# Patient Record
Sex: Male | Born: 1975 | Race: White | Hispanic: No | Smoking: Former smoker
Health system: Southern US, Community
[De-identification: ages and names within clinical notes are randomized; demographics above are authoritative.]

---

## 2006-05-05 ENCOUNTER — Ambulatory Visit (HOSPITAL_BASED_OUTPATIENT_CLINIC_OR_DEPARTMENT_OTHER): Admission: RE | Admit: 2006-05-05 | Discharge: 2006-05-05 | Payer: Self-pay | Admitting: Otolaryngology

## 2017-01-24 DIAGNOSIS — L309 Dermatitis, unspecified: Secondary | ICD-10-CM | POA: Diagnosis not present

## 2017-01-24 DIAGNOSIS — A6000 Herpesviral infection of urogenital system, unspecified: Secondary | ICD-10-CM | POA: Diagnosis not present

## 2017-05-23 DIAGNOSIS — Z0000 Encounter for general adult medical examination without abnormal findings: Secondary | ICD-10-CM | POA: Diagnosis not present

## 2017-05-23 DIAGNOSIS — Z23 Encounter for immunization: Secondary | ICD-10-CM | POA: Diagnosis not present

## 2017-05-23 DIAGNOSIS — E785 Hyperlipidemia, unspecified: Secondary | ICD-10-CM | POA: Diagnosis not present

## 2017-08-30 DIAGNOSIS — Z3009 Encounter for other general counseling and advice on contraception: Secondary | ICD-10-CM | POA: Diagnosis not present

## 2017-10-06 ENCOUNTER — Other Ambulatory Visit: Payer: Self-pay | Admitting: Family Medicine

## 2017-10-06 DIAGNOSIS — Z302 Encounter for sterilization: Secondary | ICD-10-CM | POA: Diagnosis not present

## 2019-05-13 DIAGNOSIS — A6000 Herpesviral infection of urogenital system, unspecified: Secondary | ICD-10-CM | POA: Diagnosis not present

## 2020-02-18 DIAGNOSIS — M67911 Unspecified disorder of synovium and tendon, right shoulder: Secondary | ICD-10-CM | POA: Diagnosis not present

## 2020-06-05 DIAGNOSIS — A6000 Herpesviral infection of urogenital system, unspecified: Secondary | ICD-10-CM | POA: Diagnosis not present

## 2020-10-26 ENCOUNTER — Encounter (HOSPITAL_COMMUNITY): Admission: EM | Disposition: A | Discharge status: Home / Self Care | Payer: Self-pay | Source: Home / Self Care

## 2020-10-26 ENCOUNTER — Inpatient Hospital Stay (HOSPITAL_COMMUNITY): Payer: BC Managed Care – PPO | Admitting: Certified Registered Nurse Anesthetist

## 2020-10-26 ENCOUNTER — Inpatient Hospital Stay (HOSPITAL_COMMUNITY)
Admission: EM | Admit: 2020-10-26 | Discharge: 2020-10-29 | DRG: 330 | Disposition: A | Discharge status: Home / Self Care | Payer: BC Managed Care – PPO | Attending: General Surgery | Admitting: General Surgery

## 2020-10-26 ENCOUNTER — Other Ambulatory Visit: Payer: Self-pay

## 2020-10-26 ENCOUNTER — Encounter (HOSPITAL_COMMUNITY): Payer: Self-pay

## 2020-10-26 ENCOUNTER — Emergency Department (HOSPITAL_COMMUNITY): Payer: BC Managed Care – PPO

## 2020-10-26 DIAGNOSIS — E872 Acidosis: Secondary | ICD-10-CM | POA: Diagnosis not present

## 2020-10-26 DIAGNOSIS — K56609 Unspecified intestinal obstruction, unspecified as to partial versus complete obstruction: Secondary | ICD-10-CM | POA: Diagnosis not present

## 2020-10-26 DIAGNOSIS — R7989 Other specified abnormal findings of blood chemistry: Secondary | ICD-10-CM

## 2020-10-26 DIAGNOSIS — K55029 Acute infarction of small intestine, extent unspecified: Secondary | ICD-10-CM | POA: Diagnosis not present

## 2020-10-26 DIAGNOSIS — K5669 Other partial intestinal obstruction: Secondary | ICD-10-CM | POA: Diagnosis not present

## 2020-10-26 DIAGNOSIS — Z20822 Contact with and (suspected) exposure to covid-19: Secondary | ICD-10-CM | POA: Diagnosis present

## 2020-10-26 DIAGNOSIS — K5530 Necrotizing enterocolitis, unspecified: Secondary | ICD-10-CM | POA: Diagnosis not present

## 2020-10-26 DIAGNOSIS — R6510 Systemic inflammatory response syndrome (SIRS) of non-infectious origin without acute organ dysfunction: Secondary | ICD-10-CM | POA: Diagnosis present

## 2020-10-26 DIAGNOSIS — R109 Unspecified abdominal pain: Secondary | ICD-10-CM | POA: Diagnosis not present

## 2020-10-26 DIAGNOSIS — R001 Bradycardia, unspecified: Secondary | ICD-10-CM | POA: Diagnosis not present

## 2020-10-26 DIAGNOSIS — R7401 Elevation of levels of liver transaminase levels: Secondary | ICD-10-CM | POA: Diagnosis not present

## 2020-10-26 DIAGNOSIS — R1084 Generalized abdominal pain: Secondary | ICD-10-CM | POA: Diagnosis not present

## 2020-10-26 HISTORY — PX: LAPAROTOMY: SHX154

## 2020-10-26 HISTORY — PX: BOWEL RESECTION: SHX1257

## 2020-10-26 LAB — URINALYSIS, ROUTINE W REFLEX MICROSCOPIC
Bilirubin Urine: NEGATIVE
Glucose, UA: NEGATIVE mg/dL
Hgb urine dipstick: NEGATIVE
Ketones, ur: NEGATIVE mg/dL
Leukocytes,Ua: NEGATIVE
Nitrite: NEGATIVE
Protein, ur: NEGATIVE mg/dL
Specific Gravity, Urine: 1.021 (ref 1.005–1.030)
pH: 5 (ref 5.0–8.0)

## 2020-10-26 LAB — COMPREHENSIVE METABOLIC PANEL WITH GFR
ALT: 28 U/L (ref 0–44)
AST: 32 U/L (ref 15–41)
Albumin: 4.3 g/dL (ref 3.5–5.0)
Alkaline Phosphatase: 65 U/L (ref 38–126)
Anion gap: 21 — ABNORMAL HIGH (ref 5–15)
BUN: 26 mg/dL — ABNORMAL HIGH (ref 6–20)
CO2: 17 mmol/L — ABNORMAL LOW (ref 22–32)
Calcium: 9.3 mg/dL (ref 8.9–10.3)
Chloride: 101 mmol/L (ref 98–111)
Creatinine, Ser: 1.4 mg/dL — ABNORMAL HIGH (ref 0.61–1.24)
GFR, Estimated: >60 mL/min
Glucose, Bld: 189 mg/dL — ABNORMAL HIGH (ref 70–99)
Potassium: 3.3 mmol/L — ABNORMAL LOW (ref 3.5–5.1)
Sodium: 139 mmol/L (ref 135–145)
Total Bilirubin: 0.7 mg/dL (ref 0.3–1.2)
Total Protein: 6.8 g/dL (ref 6.5–8.1)

## 2020-10-26 LAB — RESP PANEL BY RT-PCR (FLU A&B, COVID) ARPGX2
Influenza A by PCR: NEGATIVE
Influenza B by PCR: NEGATIVE
SARS Coronavirus 2 by RT PCR: NEGATIVE

## 2020-10-26 LAB — LACTIC ACID, PLASMA: Lactic Acid, Venous: 7.1 mmol/L (ref 0.5–1.9)

## 2020-10-26 LAB — CBC
HCT: 46.4 % (ref 39.0–52.0)
HCT: 44.1 % (ref 39.0–52.0)
Hemoglobin: 15.9 g/dL (ref 13.0–17.0)
Hemoglobin: 15.3 g/dL (ref 13.0–17.0)
MCH: 30.2 pg (ref 26.0–34.0)
MCH: 30.4 pg (ref 26.0–34.0)
MCHC: 34.3 g/dL (ref 30.0–36.0)
MCHC: 34.7 g/dL (ref 30.0–36.0)
MCV: 88.2 fL (ref 80.0–100.0)
MCV: 87.7 fL (ref 80.0–100.0)
Platelets: 233 K/uL (ref 150–400)
Platelets: 188 10*3/uL (ref 150–400)
RBC: 5.26 MIL/uL (ref 4.22–5.81)
RBC: 5.03 MIL/uL (ref 4.22–5.81)
RDW: 12.5 % (ref 11.5–15.5)
RDW: 12.5 % (ref 11.5–15.5)
WBC: 10.7 K/uL — ABNORMAL HIGH (ref 4.0–10.5)
WBC: 14.1 10*3/uL — ABNORMAL HIGH (ref 4.0–10.5)
nRBC: 0 % (ref 0.0–0.2)
nRBC: 0 % (ref 0.0–0.2)

## 2020-10-26 LAB — HIV ANTIBODY (ROUTINE TESTING W REFLEX): HIV Screen 4th Generation wRfx: NONREACTIVE

## 2020-10-26 LAB — CREATININE, SERUM
Creatinine, Ser: 1.17 mg/dL (ref 0.61–1.24)
GFR, Estimated: >60 mL/min (ref >60)

## 2020-10-26 LAB — LIPASE, BLOOD: Lipase: 32 U/L (ref 11–51)

## 2020-10-26 SURGERY — LAPAROTOMY, EXPLORATORY
Anesthesia: General | Site: Abdomen

## 2020-10-26 MED ORDER — FENTANYL CITRATE (PF) 250 MCG/5ML IJ SOLN
INTRAMUSCULAR | Status: AC
  Filled 2020-10-26: qty 5

## 2020-10-26 MED ORDER — DEXMEDETOMIDINE (PRECEDEX) IN NS 20 MCG/5ML (4 MCG/ML) IV SYRINGE
PREFILLED_SYRINGE | INTRAVENOUS | Status: DC | PRN
  Administered 2020-10-26: 20 ug via INTRAVENOUS

## 2020-10-26 MED ORDER — ONDANSETRON HCL 4 MG/2ML IJ SOLN
4.0000 mg | Freq: Once | INTRAMUSCULAR | Status: AC
  Administered 2020-10-26: 4 mg via INTRAVENOUS
  Filled 2020-10-26: qty 2

## 2020-10-26 MED ORDER — FENTANYL CITRATE (PF) 100 MCG/2ML IJ SOLN: 50.0000 ug | Freq: Once | INTRAMUSCULAR | Status: DC

## 2020-10-26 MED ORDER — DEXAMETHASONE SODIUM PHOSPHATE 10 MG/ML IJ SOLN
INTRAMUSCULAR | Status: DC | PRN
  Administered 2020-10-26: 5 mg via INTRAVENOUS

## 2020-10-26 MED ORDER — ONDANSETRON HCL 4 MG/2ML IJ SOLN
INTRAMUSCULAR | Status: AC
  Filled 2020-10-26: qty 2

## 2020-10-26 MED ORDER — ONDANSETRON HCL 4 MG/2ML IJ SOLN
4.0000 mg | Freq: Four times a day (QID) | INTRAMUSCULAR | Status: DC | PRN
  Administered 2020-10-26: 4 mg via INTRAVENOUS
  Filled 2020-10-26: qty 2

## 2020-10-26 MED ORDER — ENOXAPARIN SODIUM 40 MG/0.4ML ~~LOC~~ SOLN
40.0000 mg | Freq: Every day | SUBCUTANEOUS | Status: DC
  Administered 2020-10-26 – 2020-10-27 (×2): 40 mg via SUBCUTANEOUS
  Filled 2020-10-26 (×4): qty 0.4

## 2020-10-26 MED ORDER — LACTATED RINGERS IV SOLN: INTRAVENOUS | Status: DC

## 2020-10-26 MED ORDER — KETAMINE HCL 50 MG/5ML IJ SOSY
PREFILLED_SYRINGE | INTRAMUSCULAR | Status: AC
  Filled 2020-10-26: qty 5

## 2020-10-26 MED ORDER — OXYCODONE HCL 5 MG PO TABS
5.0000 mg | ORAL_TABLET | ORAL | Status: DC | PRN
  Administered 2020-10-28 (×2): 5 mg via ORAL
  Filled 2020-10-26 (×3): qty 1

## 2020-10-26 MED ORDER — MIDAZOLAM HCL 2 MG/2ML IJ SOLN
INTRAMUSCULAR | Status: DC | PRN
  Administered 2020-10-26: 2 mg via INTRAVENOUS

## 2020-10-26 MED ORDER — ROCURONIUM BROMIDE 10 MG/ML (PF) SYRINGE
PREFILLED_SYRINGE | INTRAVENOUS | Status: AC
  Filled 2020-10-26: qty 20

## 2020-10-26 MED ORDER — LIDOCAINE 2% (20 MG/ML) 5 ML SYRINGE
INTRAMUSCULAR | Status: AC
  Filled 2020-10-26: qty 5

## 2020-10-26 MED ORDER — GABAPENTIN 300 MG PO CAPS
300.0000 mg | ORAL_CAPSULE | Freq: Three times a day (TID) | ORAL | Status: DC
  Administered 2020-10-26 – 2020-10-29 (×10): 300 mg via ORAL
  Filled 2020-10-26 (×10): qty 1

## 2020-10-26 MED ORDER — HYDROMORPHONE HCL 1 MG/ML IJ SOLN
1.0000 mg | Freq: Once | INTRAMUSCULAR | Status: AC
  Administered 2020-10-26: 1 mg via INTRAVENOUS
  Filled 2020-10-26: qty 1

## 2020-10-26 MED ORDER — FENTANYL CITRATE (PF) 100 MCG/2ML IJ SOLN
100.0000 ug | Freq: Once | INTRAMUSCULAR | Status: AC
  Administered 2020-10-26: 100 ug via INTRAVENOUS
  Filled 2020-10-26: qty 2

## 2020-10-26 MED ORDER — LIDOCAINE 2% (20 MG/ML) 5 ML SYRINGE
INTRAMUSCULAR | Status: DC | PRN
  Administered 2020-10-26: 60 mg via INTRAVENOUS

## 2020-10-26 MED ORDER — METRONIDAZOLE IN NACL 5-0.79 MG/ML-% IV SOLN: 500.0000 mg | Freq: Once | INTRAVENOUS | Status: DC

## 2020-10-26 MED ORDER — SUGAMMADEX SODIUM 200 MG/2ML IV SOLN
INTRAVENOUS | Status: DC | PRN
  Administered 2020-10-26: 200 mg via INTRAVENOUS

## 2020-10-26 MED ORDER — ROCURONIUM BROMIDE 10 MG/ML (PF) SYRINGE
PREFILLED_SYRINGE | INTRAVENOUS | Status: DC | PRN
  Administered 2020-10-26: 50 mg via INTRAVENOUS
  Administered 2020-10-26: 20 mg via INTRAVENOUS
  Administered 2020-10-26: 10 mg via INTRAVENOUS
  Administered 2020-10-26: 20 mg via INTRAVENOUS

## 2020-10-26 MED ORDER — SUCCINYLCHOLINE CHLORIDE 200 MG/10ML IV SOSY
PREFILLED_SYRINGE | INTRAVENOUS | Status: DC | PRN
  Administered 2020-10-26: 180 mg via INTRAVENOUS

## 2020-10-26 MED ORDER — PROPOFOL 10 MG/ML IV BOLUS
INTRAVENOUS | Status: AC
  Filled 2020-10-26: qty 20

## 2020-10-26 MED ORDER — SUCCINYLCHOLINE CHLORIDE 200 MG/10ML IV SOSY
PREFILLED_SYRINGE | INTRAVENOUS | Status: AC
  Filled 2020-10-26: qty 10

## 2020-10-26 MED ORDER — ACETAMINOPHEN 10 MG/ML IV SOLN
INTRAVENOUS | Status: AC
  Filled 2020-10-26: qty 100

## 2020-10-26 MED ORDER — SODIUM CHLORIDE 0.9 % IV BOLUS (SEPSIS)
1000.0000 mL | Freq: Once | INTRAVENOUS | Status: AC
  Administered 2020-10-26: 1000 mL via INTRAVENOUS

## 2020-10-26 MED ORDER — SODIUM CHLORIDE 0.9 % IV SOLN: 1000.0000 mL | INTRAVENOUS | Status: DC

## 2020-10-26 MED ORDER — ACETAMINOPHEN 325 MG PO TABS
650.0000 mg | ORAL_TABLET | Freq: Four times a day (QID) | ORAL | Status: DC
  Administered 2020-10-26 – 2020-10-29 (×10): 650 mg via ORAL
  Filled 2020-10-26 (×11): qty 2

## 2020-10-26 MED ORDER — 0.9 % SODIUM CHLORIDE (POUR BTL) OPTIME
TOPICAL | Status: DC | PRN
  Administered 2020-10-26 (×3): 1000 mL

## 2020-10-26 MED ORDER — FENTANYL CITRATE (PF) 100 MCG/2ML IJ SOLN: 25.0000 ug | INTRAMUSCULAR | Status: DC | PRN

## 2020-10-26 MED ORDER — CEFAZOLIN (ANCEF) 1 G IV SOLR: 1.0000 g | INTRAVENOUS | Status: DC

## 2020-10-26 MED ORDER — OXYCODONE HCL 5 MG PO TABS
10.0000 mg | ORAL_TABLET | ORAL | Status: DC | PRN
  Administered 2020-10-26 – 2020-10-29 (×9): 10 mg via ORAL
  Filled 2020-10-26 (×8): qty 2

## 2020-10-26 MED ORDER — ONDANSETRON HCL 4 MG/2ML IJ SOLN
INTRAMUSCULAR | Status: DC | PRN
  Administered 2020-10-26: 4 mg via INTRAVENOUS

## 2020-10-26 MED ORDER — SODIUM CHLORIDE 0.9 % IV BOLUS (SEPSIS): 1000.0000 mL | Freq: Once | INTRAVENOUS | Status: DC

## 2020-10-26 MED ORDER — FENTANYL CITRATE (PF) 250 MCG/5ML IJ SOLN
INTRAMUSCULAR | Status: DC | PRN
  Administered 2020-10-26: 100 ug via INTRAVENOUS
  Administered 2020-10-26 (×3): 50 ug via INTRAVENOUS

## 2020-10-26 MED ORDER — MIDAZOLAM HCL 2 MG/2ML IJ SOLN
INTRAMUSCULAR | Status: AC
  Filled 2020-10-26: qty 2

## 2020-10-26 MED ORDER — ONDANSETRON 4 MG PO TBDP: 4.0000 mg | ORAL_TABLET | Freq: Four times a day (QID) | ORAL | Status: DC | PRN

## 2020-10-26 MED ORDER — KETAMINE HCL 10 MG/ML IJ SOLN
INTRAMUSCULAR | Status: DC | PRN
  Administered 2020-10-26: 40 mg via INTRAVENOUS

## 2020-10-26 MED ORDER — ACETAMINOPHEN 10 MG/ML IV SOLN
INTRAVENOUS | Status: DC | PRN
  Administered 2020-10-26: 1000 mg via INTRAVENOUS

## 2020-10-26 MED ORDER — HYDROMORPHONE HCL 1 MG/ML IJ SOLN: 0.5000 mg | INTRAMUSCULAR | Status: DC | PRN

## 2020-10-26 MED ORDER — DEXAMETHASONE SODIUM PHOSPHATE 10 MG/ML IJ SOLN
INTRAMUSCULAR | Status: AC
  Filled 2020-10-26: qty 1

## 2020-10-26 MED ORDER — ONDANSETRON HCL 4 MG/2ML IJ SOLN: 4.0000 mg | Freq: Once | INTRAMUSCULAR | Status: DC | PRN

## 2020-10-26 MED ORDER — ARTIFICIAL TEARS OPHTHALMIC OINT
TOPICAL_OINTMENT | OPHTHALMIC | Status: AC
  Filled 2020-10-26: qty 3.5

## 2020-10-26 MED ORDER — PIPERACILLIN-TAZOBACTAM 3.375 G IVPB 30 MIN
3.3750 g | Freq: Once | INTRAVENOUS | Status: AC
  Administered 2020-10-26: 3.375 g via INTRAVENOUS
  Filled 2020-10-26: qty 50

## 2020-10-26 MED ORDER — PROCHLORPERAZINE MALEATE 10 MG PO TABS
10.0000 mg | ORAL_TABLET | Freq: Four times a day (QID) | ORAL | Status: DC | PRN
  Filled 2020-10-26: qty 1

## 2020-10-26 MED ORDER — PROCHLORPERAZINE EDISYLATE 10 MG/2ML IJ SOLN: 5.0000 mg | Freq: Four times a day (QID) | INTRAMUSCULAR | Status: DC | PRN

## 2020-10-26 MED ORDER — PROPOFOL 10 MG/ML IV BOLUS
INTRAVENOUS | Status: DC | PRN
  Administered 2020-10-26: 170 mg via INTRAVENOUS

## 2020-10-26 SURGICAL SUPPLY — 51 items
BLADE CLIPPER SURG (BLADE) ×2 IMPLANT
CANISTER SUCT 3000ML PPV (MISCELLANEOUS) ×2 IMPLANT
CHLORAPREP W/TINT 26 (MISCELLANEOUS) ×2 IMPLANT
COVER SURGICAL LIGHT HANDLE (MISCELLANEOUS) ×2 IMPLANT
DERMABOND ADVANCED (GAUZE/BANDAGES/DRESSINGS) ×1
DERMABOND ADVANCED .7 DNX12 (GAUZE/BANDAGES/DRESSINGS) ×1 IMPLANT
DRAIN PENROSE 1/4X12 LTX STRL (WOUND CARE) ×2 IMPLANT
DRAPE LAPAROSCOPIC ABDOMINAL (DRAPES) ×2 IMPLANT
DRAPE WARM FLUID 44X44 (DRAPES) ×2 IMPLANT
DRSG OPSITE POSTOP 4X10 (GAUZE/BANDAGES/DRESSINGS) ×2 IMPLANT
DRSG OPSITE POSTOP 4X8 (GAUZE/BANDAGES/DRESSINGS) IMPLANT
ELECT BLADE 6.5 EXT (BLADE) ×2 IMPLANT
ELECT CAUTERY BLADE 6.4 (BLADE) ×2 IMPLANT
ELECT REM PT RETURN 9FT ADLT (ELECTROSURGICAL) ×2
ELECTRODE REM PT RTRN 9FT ADLT (ELECTROSURGICAL) ×1 IMPLANT
GLOVE BIO SURGEON STRL SZ7.5 (GLOVE) ×2 IMPLANT
GLOVE BIOGEL PI IND STRL 8 (GLOVE) ×1 IMPLANT
GLOVE BIOGEL PI INDICATOR 8 (GLOVE) ×1
GLOVE SURG SS PI 7.0 STRL IVOR (GLOVE) ×2 IMPLANT
GLOVE SURG UNDER POLY LF SZ7 (GLOVE) ×2 IMPLANT
GOWN STRL REUS W/ TWL LRG LVL3 (GOWN DISPOSABLE) ×1 IMPLANT
GOWN STRL REUS W/ TWL XL LVL3 (GOWN DISPOSABLE) ×1 IMPLANT
GOWN STRL REUS W/TWL LRG LVL3 (GOWN DISPOSABLE) ×2
GOWN STRL REUS W/TWL XL LVL3 (GOWN DISPOSABLE) ×2
HANDLE SUCTION POOLE (INSTRUMENTS) ×1 IMPLANT
KIT BASIN OR (CUSTOM PROCEDURE TRAY) ×2 IMPLANT
KIT TURNOVER KIT B (KITS) ×2 IMPLANT
LIGASURE IMPACT 36 18CM CVD LR (INSTRUMENTS) ×2 IMPLANT
NS IRRIG 1000ML POUR BTL (IV SOLUTION) ×4 IMPLANT
PACK GENERAL/GYN (CUSTOM PROCEDURE TRAY) ×2 IMPLANT
PAD ARMBOARD 7.5X6 YLW CONV (MISCELLANEOUS) ×2 IMPLANT
PENCIL SMOKE EVACUATOR (MISCELLANEOUS) ×2 IMPLANT
RELOAD PROXIMATE 75MM BLUE (ENDOMECHANICALS) ×6 IMPLANT
SLEEVE SCD COMPRESS KNEE MED (MISCELLANEOUS) ×2 IMPLANT
SPECIMEN JAR LARGE (MISCELLANEOUS) IMPLANT
SPONGE LAP 18X18 RF (DISPOSABLE) IMPLANT
STAPLER PROXIMATE 75MM BLUE (STAPLE) ×2 IMPLANT
STAPLER VISISTAT 35W (STAPLE) ×2 IMPLANT
SUCTION POOLE HANDLE (INSTRUMENTS) ×2
SUT MNCRL AB 4-0 PS2 18 (SUTURE) ×2 IMPLANT
SUT PDS AB 1 TP1 54 (SUTURE) ×4 IMPLANT
SUT PDS AB 2-0 CT1 27 (SUTURE) ×4 IMPLANT
SUT SILK 2 0 SH CR/8 (SUTURE) ×2 IMPLANT
SUT SILK 2 0 TIES 10X30 (SUTURE) ×2 IMPLANT
SUT SILK 3 0 SH CR/8 (SUTURE) ×2 IMPLANT
SUT SILK 3 0 TIES 10X30 (SUTURE) ×2 IMPLANT
SUT VIC AB 2-0 SH 18 (SUTURE) ×4 IMPLANT
SUT VIC AB 3-0 SH 8-18 (SUTURE) ×2 IMPLANT
TOWEL GREEN STERILE (TOWEL DISPOSABLE) ×2 IMPLANT
TRAY FOLEY MTR SLVR 16FR STAT (SET/KITS/TRAYS/PACK) ×2 IMPLANT
YANKAUER SUCT BULB TIP NO VENT (SUCTIONS) IMPLANT

## 2020-10-26 NOTE — Anesthesia Procedure Notes (Signed)
Procedure Name: Intubation Date/Time: 10/26/2020 7:01 AM Performed by: Janace Litten, CRNA Pre-anesthesia Checklist: Patient identified, Emergency Drugs available, Suction available and Patient being monitored Patient Re-evaluated:Patient Re-evaluated prior to induction Oxygen Delivery Method: Circle System Utilized Preoxygenation: Pre-oxygenation with 100% oxygen Induction Type: IV induction, Rapid sequence and Cricoid Pressure applied Laryngoscope Size: Mac and 4 Grade View: Grade I Tube type: Oral Tube size: 7.5 mm Number of attempts: 1 Airway Equipment and Method: Stylet Placement Confirmation: ETT inserted through vocal cords under direct vision,  positive ETCO2 and breath sounds checked- equal and bilateral Secured at: 22 cm Tube secured with: Tape Dental Injury: Teeth and Oropharynx as per pre-operative assessment

## 2020-10-26 NOTE — Transfer of Care (Signed)
Immediate Anesthesia Transfer of Care Note  Patient: Jon Brady  Procedure(s) Performed: EXPLORATORY LAPAROTOMY (N/A Abdomen) SMALL BOWEL RESECTION (N/A Abdomen)  Patient Location: PACU  Anesthesia Type:General  Level of Consciousness: drowsy and patient cooperative  Airway & Oxygen Therapy: Patient Spontanous Breathing  Post-op Assessment: Report given to RN and Post -op Vital signs reviewed and stable  Post vital signs: Reviewed and stable  Last Vitals:  Vitals Value Taken Time  BP 161/84 10/26/20 0837  Temp    Pulse 79 10/26/20 0838  Resp 24 10/26/20 0838  SpO2 97 % 10/26/20 0838  Vitals shown include unvalidated device data.  Last Pain:  Vitals:   10/26/20 0425  TempSrc:   PainSc: 9          Complications: No complications documented.

## 2020-10-26 NOTE — Anesthesia Preprocedure Evaluation (Signed)
Anesthesia Evaluation  Patient identified by MRN, date of birth, ID band Patient awake    Reviewed: Allergy & Precautions, NPO status , Patient's Chart, lab work & pertinent test results  Airway Mallampati: II  TM Distance: >3 FB Neck ROM: Full    Dental  (+) Teeth Intact, Dental Advisory Given   Pulmonary    breath sounds clear to auscultation       Cardiovascular  Rhythm:Regular Rate:Normal     Neuro/Psych    GI/Hepatic   Endo/Other    Renal/GU      Musculoskeletal   Abdominal   Peds  Hematology   Anesthesia Other Findings   Reproductive/Obstetrics                             Anesthesia Physical Anesthesia Plan  ASA: III and emergent  Anesthesia Plan: General   Post-op Pain Management:    Induction: Intravenous, Rapid sequence and Cricoid pressure planned  PONV Risk Score and Plan: Ondansetron and Dexamethasone  Airway Management Planned: Oral ETT  Additional Equipment:   Intra-op Plan:   Post-operative Plan: Possible Post-op intubation/ventilation  Informed Consent: I have reviewed the patients History and Physical, chart, labs and discussed the procedure including the risks, benefits and alternatives for the proposed anesthesia with the patient or authorized representative who has indicated his/her understanding and acceptance.     Dental advisory given  Plan Discussed with: CRNA and Anesthesiologist  Anesthesia Plan Comments:         Anesthesia Quick Evaluation

## 2020-10-26 NOTE — ED Provider Notes (Signed)
[facility] EMERGENCY DEPARTMENT Provider Note  CSN: [id number] Arrival date & time: 10/26/20 0243  Chief Complaint(s) Abdominal Pain  HPI Jon Brady is a 45 y.o. male with no pertinent past medical history who presents to the emergency department with 6 hours of gradually worsening generalized abdominal discomfort.  Pain is now severe.  Worse with movement and palpation. Associated with nausea and nonbloody nonbilious emesis. Patient also reports a few bouts of nonbloody diarrhea. Denies any known fevers or chills. No cough or congestion. No recent fevers or infections. No chest pain or shortness of breath.  HPI  Past Medical History No past medical history on file. There are no problems to display for this patient.  Home Medication(s) Prior to Admission medications   Not on File                                                                                                                                    Past Surgical History ** The histories are not reviewed yet. Please review them in the "History" navigator section and refresh this [place]. Family History No family history on file.  Social History   Allergies Patient has no known allergies.  Review of Systems Review of Systems All other systems are reviewed and are negative for acute change except as noted in the HPI  Physical Exam Vital Signs  I have reviewed the triage vital signs BP (!) 109/99 (BP Location: Left Arm)   Pulse (!) 58   Temp 98.7 F (37.1 C) (Oral)   Resp (!) 26   Ht 6\' 2"  (1.88 m)   Wt 81.6 kg   SpO2 98%   BMI 23.11 kg/m   Physical Exam Vitals reviewed.  Constitutional:      General: He is not in acute distress.    Appearance: He is well-developed and well-nourished. He is not diaphoretic.  HENT:     Head: Normocephalic and atraumatic.     Jaw: No trismus.     Right Ear: External ear normal.     Left Ear: External ear normal.     Nose: Nose normal.      Mouth/Throat:     Mouth: Mucous membranes are normal.  Eyes:     General: No scleral icterus.    Extraocular Movements: EOM normal.     Conjunctiva/sclera: Conjunctivae normal.  Neck:     Trachea: Phonation normal.  Cardiovascular:     Rate and Rhythm: Normal rate and regular rhythm.  Pulmonary:     Effort: Pulmonary effort is normal. No respiratory distress.     Breath sounds: No stridor.  Abdominal:     General: There is no distension.     Tenderness: There is generalized abdominal tenderness. There is guarding and rebound.  Musculoskeletal:        General: No edema. Normal range of motion.     Cervical back: Normal range of motion.  Neurological:     Mental Status: He is alert and oriented to person, place, and time.  Psychiatric:        Mood and Affect: Mood and affect normal.        Behavior: Behavior normal.     ED Results and Treatments Labs (all labs ordered are listed, but only abnormal results are displayed) Labs Reviewed  COMPREHENSIVE METABOLIC PANEL - Abnormal; Notable for the following components:      Result Value   Potassium 3.3 (*)    CO2 17 (*)    Glucose, Bld 189 (*)    BUN 26 (*)    Creatinine, Ser 1.40 (*)    Anion gap 21 (*)    All other components within normal limits  CBC - Abnormal; Notable for the following components:   WBC 10.7 (*)    All other components within normal limits  LACTIC ACID, PLASMA - Abnormal; Notable for the following components:   Lactic Acid, Venous 7.1 (*)    All other components within normal limits  LIPASE, BLOOD  URINALYSIS, ROUTINE W REFLEX MICROSCOPIC                                                                                                                         EKG  EKG Interpretation  Date/Time:  Monday October 26 2020 03:51:18 EST Ventricular Rate:  48 PR Interval:  192 QRS Duration: 112 QT Interval:  456 QTC Calculation: 407 R Axis:   83 Text Interpretation: Sinus bradycardia with sinus  arrhythmia Minimal voltage criteria for LVH, may be normal variant ( Cornell product ) Borderline ECG No old tracing to compare Confirmed by Drema Pry [phone]) on 10/26/2020 4:42:16 AM      Radiology CT Renal Stone Study  Addendum Date: 10/26/2020   ADDENDUM REPORT: 10/26/2020 04:28 ADDENDUM: These results were called by telephone at the time of interpretation on 10/26/2020 at 4:28 am to provider [facility] , who verbally acknowledged these results. Electronically Signed   By: Helyn Numbers MD   On: 10/26/2020 04:28   Result Date: 10/26/2020 CLINICAL DATA:  Bilateral flank pain, epigastric abdominal pain EXAM: CT ABDOMEN AND PELVIS WITHOUT CONTRAST TECHNIQUE: Multidetector CT imaging of the abdomen and pelvis was performed following the standard protocol without IV contrast. COMPARISON:  None. FINDINGS: Lower chest: The visualized lung bases are clear bilaterally. Visualized heart and pericardium are unremarkable. Hepatobiliary: No focal liver abnormality is seen. No gallstones, gallbladder wall thickening, or biliary dilatation. Pancreas: Unremarkable Spleen: Unremarkable Adrenals/Urinary Tract: Adrenal glands are unremarkable. Kidneys are normal, without renal calculi, focal lesion, or hydronephrosis. Bladder is unremarkable. Stomach/Bowel: There is a closed loop small bowel obstruction with 2 points of transition in close proximity to each other seen within the left upper quadrant of the abdomen with the first at axial image # 33 and coronal image # 32 and the second at axial image # 47 and coronal image # 28. The intervening small bowel is dilated  fluid-filled, thick walled, and demonstrates extensive mesenteric edema. No evidence of perforation at this time, however. No free intraperitoneal gas or fluid. Small hiatal hernia. The proximal stomach and small bowel as well as the colon distally are decompressed and are unremarkable. The appendix is normal. Vascular/Lymphatic: No significant vascular  findings are present. No enlarged abdominal or pelvic lymph nodes. Reproductive: Prostate is unremarkable. Other: No pathologic adenopathy.  Rectum unremarkable. Musculoskeletal: No acute bone abnormality. IMPRESSION: Closed loop obstruction with 2 points of transition within the left upper quadrant involving the small bowel. The intervening loop of small bowel is dilated, thick-walled, and demonstrates extensive mesenteric edema, however, there is no evidence of perforation at this time. Urgent surgical consultation is warranted. Attempts are being made at this time to contact the managing clinician for direct verbal communication. Electronically Signed: By: Helyn Numbers MD On: 10/26/2020 04:10    Pertinent labs & imaging results that were available during my care of the patient were reviewed by me and considered in my medical decision making (see chart for details).  Medications Ordered in ED Medications  HYDROmorphone (DILAUDID) injection 1 mg (has no administration in time range)  sodium chloride 0.9 % bolus 1,000 mL (has no administration in time range)    Followed by  sodium chloride 0.9 % bolus 1,000 mL (has no administration in time range)    Followed by  0.9 %  sodium chloride infusion (has no administration in time range)  fentaNYL (SUBLIMAZE) injection 100 mcg (100 mcg Intravenous Given 10/26/20 0321)  ondansetron (ZOFRAN) injection 4 mg (4 mg Intravenous Given 10/26/20 0320)                                                                                                                                    Procedures .Critical Care Performed by: Nira Conn, MD Authorized by: Nira Conn, MD   Critical care provider statement:    Critical care time (minutes):  45   Critical care was necessary to treat or prevent imminent or life-threatening deterioration of the following conditions: ischemic bowel due to obstruction.   Critical care was time spent personally by me  on the following activities:  Discussions with consultants, evaluation of patient's response to treatment, examination of patient, ordering and performing treatments and interventions, ordering and review of laboratory studies, ordering and review of radiographic studies, pulse oximetry, re-evaluation of patient's condition, obtaining history from patient or surrogate and review of old charts    (including critical care time)  Medical Decision Making / ED Course I have reviewed the nursing notes for this encounter and the patient's prior records (if available in EHR or on provided paperwork).   Jon Brady was evaluated in Emergency Department on 10/26/2020 for the symptoms described in the history of present illness. He was evaluated in the context of the global COVID-19 pandemic, which necessitated consideration that the patient might be at risk  for infection with the SARS-CoV-2 virus that causes COVID-19. Institutional protocols and algorithms that pertain to the evaluation of patients at risk for COVID-19 are in a state of rapid change based on information released by regulatory bodies including the CDC and federal and state organizations. These policies and algorithms were followed during the patient's care in the ED.  Patient seen in triage initially. Concerning for stone vs perforated bowel. Labs and imaging ordered. IV fentanyl given.  4:50 AM Work up notable for closed loop SBO with lactic of 7 concerning for bowel ischemia. Not likely related to sepsis, but ppx abx given. IVF boluses given. Additional pain meds. NGT ordered. Consulted surgery for further management.       Final Clinical Impression(s) / ED Diagnoses Final diagnoses:  SBO (small bowel obstruction) (HCC)  Elevated lactic acid level      This chart was dictated using voice recognition software.  Despite best efforts to proofread,  errors can occur which can change the documentation meaning.   Nira Conn, MD 10/26/20 [phone]

## 2020-10-26 NOTE — H&P (Signed)
Admitting Physician: Hyman Hopes Anders Hohmann  Service: General surgery  CC: Abdominal pain  Subjective   HPI: Jon Brady is an 45 y.o. male who is here for pain.  He said the pain started earlier this evening.  The pain is incredibly severe.  He has never had a abdominal surgeries before.  The pain is located across the upper abdomen.  He has no past medical problems  He has no previous surgeries  He denies problems with anesthesia, bleeding disorders in the family.  His sister did have blood clots after pregnancy.  Social:  has no history on file for tobacco use, alcohol use, and drug use.  Allergies: No Known Allergies  Medications: No current outpatient medications  ROS - all of the below systems have been reviewed with the patient and positives are indicated with bold text General: chills, fever or night sweats Eyes: blurry vision or double vision ENT: epistaxis or sore throat Allergy/Immunology: itchy/watery eyes or nasal congestion Hematologic/Lymphatic: bleeding problems, blood clots or swollen lymph nodes Endocrine: temperature intolerance or unexpected weight changes Breast: new or changing breast lumps or nipple discharge Resp: cough, shortness of breath, or wheezing CV: chest pain or dyspnea on exertion GI: as per HPI GU: dysuria, trouble voiding, or hematuria MSK: joint pain or joint stiffness Neuro: TIA or stroke symptoms Derm: pruritus and skin lesion changes Psych: anxiety and depression  Objective   PE Blood pressure (!) 138/95, pulse 69, temperature 98.7 F (37.1 C), temperature source Oral, resp. rate 16, height 6\' 2"  (1.88 m), weight 81.6 kg, SpO2 99 %. Constitutional: NAD; conversant; no deformities Eyes: Moist conjunctiva; no lid lag; anicteric; PERRL Neck: Trachea midline; no thyromegaly Lungs: Normal respiratory effort; no tactile fremitus CV: RRR; no palpable thrills; no pitting edema GI: Abd severe abdominal pain throughout the upper  abdomen with some guarding; no palpable hepatosplenomegaly MSK: Normal range of motion of extremities; no clubbing/cyanosis Psychiatric: Appropriate affect; alert and oriented x3 Lymphatic: No palpable cervical or axillary lymphadenopathy  Results for orders placed or performed during the hospital encounter of 10/26/20 (from the past 24 hour(s))  Lipase, blood     Status: None   Collection Time: 10/26/20  3:01 AM  Result Value Ref Range   Lipase 32 11 - 51 U/L  Comprehensive metabolic panel     Status: Abnormal   Collection Time: 10/26/20  3:01 AM  Result Value Ref Range   Sodium 139 135 - 145 mmol/L   Potassium 3.3 (L) 3.5 - 5.1 mmol/L   Chloride 101 98 - 111 mmol/L   CO2 17 (L) 22 - 32 mmol/L   Glucose, Bld 189 (H) 70 - 99 mg/dL   BUN 26 (H) 6 - 20 mg/dL   Creatinine, Ser 12/24/20 (H) 0.61 - 1.24 mg/dL   Calcium 9.3 8.9 - 1.76 mg/dL   Total Protein 6.8 6.5 - 8.1 g/dL   Albumin 4.3 3.5 - 5.0 g/dL   AST 32 15 - 41 U/L   ALT 28 0 - 44 U/L   Alkaline Phosphatase 65 38 - 126 U/L   Total Bilirubin 0.7 0.3 - 1.2 mg/dL   GFR, Estimated 16.0 >73 mL/min   Anion gap 21 (H) 5 - 15  CBC     Status: Abnormal   Collection Time: 10/26/20  3:01 AM  Result Value Ref Range   WBC 10.7 (H) 4.0 - 10.5 K/uL   RBC 5.26 4.22 - 5.81 MIL/uL   Hemoglobin 15.9 13.0 - 17.0 g/dL  HCT 46.4 39.0 - 52.0 %   MCV 88.2 80.0 - 100.0 fL   MCH 30.2 26.0 - 34.0 pg   MCHC 34.3 30.0 - 36.0 g/dL   RDW 09.6 28.3 - 66.2 %   Platelets 233 150 - 400 K/uL   nRBC 0.0 0.0 - 0.2 %  Lactic acid, plasma     Status: Abnormal   Collection Time: 10/26/20  3:24 AM  Result Value Ref Range   Lactic Acid, Venous 7.1 (HH) 0.5 - 1.9 mmol/L     Imaging Orders     CT Renal Stone Study   Assessment and Plan   Jon Brady is an 45 y.o. male with no previous surgical history, who presented with abdominal pain, found to have a closed loop small bowel obstruction with lactic acidosis.  I recommended exploratory laparotomy with  small bowel resection to the patient.  The procedure itself as well as its risk, benefits, and alternatives were discussed with patient.  Risk discussed included but were not limited to the risk of infection, bleeding, damage nearby structures.  After full discussion and all questions answered the patient granted consent to proceed to the operating room.  He is receiving multiple boluses of crystalloid in the ER and his COVID-19 test is pending.  We will had to the operating room as soon as room is ready.   Ivar Drape, M.D. General, Bariatric and Minimally Invasive Surgery  [facility] Use [email or web address] to contact on call provider

## 2020-10-26 NOTE — Anesthesia Postprocedure Evaluation (Signed)
Anesthesia Post Note  Patient: Arlan Birks  Procedure(s) Performed: EXPLORATORY LAPAROTOMY (N/A Abdomen) SMALL BOWEL RESECTION (N/A Abdomen)     Patient location during evaluation: PACU Anesthesia Type: General Level of consciousness: awake and alert and oriented Pain management: pain level controlled Vital Signs Assessment: post-procedure vital signs reviewed and stable Respiratory status: spontaneous breathing, nonlabored ventilation and respiratory function stable Cardiovascular status: blood pressure returned to baseline Postop Assessment: no apparent nausea or vomiting Anesthetic complications: no   No complications documented.  Last Vitals:  Vitals:   10/26/20 0922 10/26/20 0938  BP: 136/69 122/65  Pulse: 69 72  Resp: 20 18  Temp: 36.7 C 37.4 C  SpO2: 96% 94%    Last Pain:  Vitals:   10/26/20 0938  TempSrc: Oral  PainSc: 8                  Kaylyn Layer

## 2020-10-26 NOTE — Op Note (Signed)
Patient: Jon Brady ([birth year], [id number])  Date of Surgery: 10/26/2020   Preoperative Diagnosis: Closed-loop small bowel obstruction  Postoperative Diagnosis: Closed-loop small bowel obstruction with necrotic bowel  Surgical Procedure: Open small bowel resection (130 cm of necrotic bowel)     Exploratory laparotomy  Operative Team Members:  Surgeon(s) and Role:    * Loyd Marhefka, Hyman Hopes, MD - Primary    * Kinsinger, De Blanch, MD - Assisting   Anesthesiologist: Kaylyn Layer, MD; Kipp Brood, MD CRNA: Drema Pry, CRNA   Anesthesia: General   Fluids:  Total I/O In: 1100 [I.V.:1000; IV Piggyback:100] Out: 350 [Urine:200; Blood:150]  Complications: None  Drains:  none   Specimen:  ID Type Source Tests Collected by Time Destination  1 : Small Bowel Tissue PATH GI benign resection SURGICAL PATHOLOGY Keziah Avis, Hyman Hopes, MD 10/26/2020 [phone]      Disposition:  PACU - hemodynamically stable.  Plan of Care: Admit to inpatient   Indications for Procedure: Jon Brady is a 45 y.o. male who presented with abdominal pain, found to have a closed-loop small bowel obstruction with evidence of intestinal ischemia on CT scan and lab work.  For this I recommended exploratory laparotomy with small bowel resection.  The procedure itself as well as its risk, benefits, and alternatives were discussed the patient.  The risk discussed included but were not limited to the risk of infection, bleeding, damage nearby structures.  With this discussion complete and all questions answered the patient granted consent to proceed.  Findings: 130 cm of necrotic small intestine, no clear band or cause of obstruction  Description of Procedure: The date stated above patient taken operating room suite and placed in supine position.  General endotracheal anesthesia was induced.  Antibiotics were given prior to the case start.  SCDs were placed on patient's lower extremities.  A timeout was completed  verifying the correct patient, procedure, positioning, and equipment needed for the case.  We began by making an upper midline laparotomy incision.  I dissected down through the fascia anterior abdominal wall and entered the abdomen without trauma the underlying viscera.  The small bowel was eviscerated and run from the ligament of Treitz to the ileocecal valve.  I encountered 130 cm of necrotic appearing small intestine in the mid portion of the small intestine.  I continue to explore the remainder of the abdomen for an explanation of this necrotic segment of intestine.  The appendix appeared normal.  The colon had no abnormalities.  The gallbladder had some attachments to the omentum but appeared generally normal.  Form ligament was intact without any holes.  The omentum was diminutive and did not have any defects.  There was no paraduodenal defect in the transverse colon mesentery.  The stomach appeared normal.  I suspicion is that the band adhesion had broken prior to the operation so we proceeded with the small bowel resection.  Windows were made in the small bowel mesentery just proximal and distal to this 130 cm segment of necrotic small intestine.  The GIA blue load was used to divide the intestine and the LigaSure was used to divide the mesentery.  The small bowel was passed off the field as a specimen.  I then performed my anastomosis in stapled fashion using the GIA stapler to anastomosis and the GIA stapler to close the common enterotomy.  A silk suture was placed to support the crotch of the staple line.  The mesenteric defect was run closed using a silk  suture.  The corner of the anastomosis was oversewn using a silk suture.  Bleeding along the staple line was controlled using figure-of-eight silk sutures.  With the anastomosis formed we return the small intestine into the abdomen and direct attention closure.  The abdomen was irrigated with multiple liters of saline irrigation.  The fascia was  closed using 2-0 PDS suture in running fashion.  The skin was closed using subcuticular Vicryl and Monocryl.  Dermabond and a honeycomb dressing were applied.  All sponge needle counts were correct at the end of this case.    Ivar Drape, MD General, Bariatric, & Minimally Invasive Surgery [facility], [place]

## 2020-10-26 NOTE — ED Notes (Signed)
Physical consent formed signed by pt, this RN as witness, and MD surgeon

## 2020-10-26 NOTE — ED Triage Notes (Signed)
Pt presents to ED POV. Pt c/o epigastric pain non radiating. Pain is 10/10. Pt is pale and diaphoretic in triage

## 2020-10-26 NOTE — ED Notes (Signed)
Dr. Eudelia Bunch made aware of lactic, 1L NS bolus started

## 2020-10-27 ENCOUNTER — Encounter (HOSPITAL_COMMUNITY): Payer: Self-pay | Admitting: Surgery

## 2020-10-27 LAB — BASIC METABOLIC PANEL
Anion gap: 6 (ref 5–15)
BUN: 10 mg/dL (ref 6–20)
CO2: 25 mmol/L (ref 22–32)
Calcium: 8.5 mg/dL — ABNORMAL LOW (ref 8.9–10.3)
Chloride: 108 mmol/L (ref 98–111)
Creatinine, Ser: 1.1 mg/dL (ref 0.61–1.24)
GFR, Estimated: >60 mL/min (ref >60)
Glucose, Bld: 109 mg/dL — ABNORMAL HIGH (ref 70–99)
Potassium: 4.8 mmol/L (ref 3.5–5.1)
Sodium: 139 mmol/L (ref 135–145)

## 2020-10-27 LAB — SURGICAL PATHOLOGY

## 2020-10-27 LAB — CBC
HCT: 42.9 % (ref 39.0–52.0)
Hemoglobin: 14.3 g/dL (ref 13.0–17.0)
MCH: 29.8 pg (ref 26.0–34.0)
MCHC: 33.3 g/dL (ref 30.0–36.0)
MCV: 89.4 fL (ref 80.0–100.0)
Platelets: 160 10*3/uL (ref 150–400)
RBC: 4.8 MIL/uL (ref 4.22–5.81)
RDW: 12.5 % (ref 11.5–15.5)
WBC: 10 10*3/uL (ref 4.0–10.5)
nRBC: 0 % (ref 0.0–0.2)

## 2020-10-27 NOTE — Plan of Care (Signed)
  Problem: Health Behavior/Discharge Planning: Goal: Ability to manage health-related needs will improve Outcome: Progressing   

## 2020-10-27 NOTE — Progress Notes (Signed)
Patient wants to try regular food.  He has not passed gas yet but his bowl sounds are active.

## 2020-10-27 NOTE — Progress Notes (Signed)
Progress Note: General Surgery Service   Chief Complaint/Subjective: No nausea or vomiting, tolerating liquids, ambulating well  Objective: Vital signs in last 24 hours: Temp:  [98 F (36.7 C)-99.8 F (37.7 C)] 98.3 F (36.8 C) (01/04 0544) Pulse Rate:  [65-76] 65 (01/04 0544) Resp:  [18-20] 20 (01/04 0544) BP: (122-143)/(65-80) 143/78 (01/04 0544) SpO2:  [94 %-96 %] 96 % (01/04 0544)    Intake/Output from previous day: 01/03 0701 - 01/04 0700 In: 1470 [P.O.:370; I.V.:1000; IV Piggyback:100] Out: 800 [Urine:650; Blood:150] Intake/Output this shift: No intake/output data recorded.  Gen: NAD  Resp: nonlabored  Card: RRR  Abd: soft, appropriately tender, incision c/d/i  Lab Results: CBC  Recent Labs    10/26/20 1019 10/27/20 0135  WBC 14.1* 10.0  HGB 15.3 14.3  HCT 44.1 42.9  PLT 188 160   BMET Recent Labs    10/26/20 0301 10/26/20 0557 10/27/20 0135  NA 139  --  139  K 3.3*  --  4.8  CL 101  --  108  CO2 17*  --  25  GLUCOSE 189*  --  109*  BUN 26*  --  10  CREATININE 1.40* 1.17 1.10  CALCIUM 9.3  --  8.5*   PT/INR No results for input(s): LABPROT, INR in the last 72 hours. ABG No results for input(s): PHART, HCO3 in the last 72 hours.  Invalid input(s): PCO2, PO2  Anti-infectives: Anti-infectives (From admission, onward)   Start     Dose/Rate Route Frequency Ordered Stop   10/26/20 0545  metroNIDAZOLE (FLAGYL) IVPB 500 mg  Status:  Discontinued        500 mg 100 mL/hr over 60 Minutes Intravenous  Once 10/26/20 0529 10/26/20 0541   10/26/20 0530  ceFAZolin (ANCEF) powder 1 g  Status:  Discontinued        1 g Other To Surgery 10/26/20 0529 10/26/20 0538   10/26/20 0500  piperacillin-tazobactam (ZOSYN) IVPB 3.375 g        3.375 g 100 mL/hr over 30 Minutes Intravenous  Once 10/26/20 0451 10/26/20 0556      Medications: Scheduled Meds: . acetaminophen  650 mg Oral Q6H  . enoxaparin (LOVENOX) injection  40 mg Subcutaneous Daily  . gabapentin   300 mg Oral TID   Continuous Infusions: . sodium chloride     Followed by  . sodium chloride    . lactated ringers 125 mL/hr at 10/27/20 0910   PRN Meds:.HYDROmorphone (DILAUDID) injection, ondansetron **OR** ondansetron (ZOFRAN) IV, oxyCODONE, oxyCODONE, prochlorperazine **OR** prochlorperazine  Assessment/Plan: s/p Procedure(s): EXPLORATORY LAPAROTOMY SMALL BOWEL RESECTION 10/26/2020 He is doing well after surgery, SIRS has resolved -continue clear liquids -await bowel function -ambulate/IS    LOS: 1 day   Rodman Pickle, MD [phone] [facility]

## 2020-10-28 LAB — BASIC METABOLIC PANEL
Anion gap: 8 (ref 5–15)
BUN: 12 mg/dL (ref 6–20)
CO2: 29 mmol/L (ref 22–32)
Calcium: 8.5 mg/dL — ABNORMAL LOW (ref 8.9–10.3)
Chloride: 103 mmol/L (ref 98–111)
Creatinine, Ser: 1.13 mg/dL (ref 0.61–1.24)
GFR, Estimated: >60 mL/min (ref >60)
Glucose, Bld: 103 mg/dL — ABNORMAL HIGH (ref 70–99)
Potassium: 4.1 mmol/L (ref 3.5–5.1)
Sodium: 140 mmol/L (ref 135–145)

## 2020-10-28 LAB — CBC
HCT: 39.2 % (ref 39.0–52.0)
Hemoglobin: 13.2 g/dL (ref 13.0–17.0)
MCH: 30.6 pg (ref 26.0–34.0)
MCHC: 33.7 g/dL (ref 30.0–36.0)
MCV: 90.7 fL (ref 80.0–100.0)
Platelets: 155 10*3/uL (ref 150–400)
RBC: 4.32 MIL/uL (ref 4.22–5.81)
RDW: 12.5 % (ref 11.5–15.5)
WBC: 6.8 10*3/uL (ref 4.0–10.5)
nRBC: 0 % (ref 0.0–0.2)

## 2020-10-28 NOTE — Progress Notes (Signed)
Progress Note: General Surgery Service   Chief Complaint/Subjective: Patient advanced to reg diet overnight by medical physician not involved in the patient's care  Objective: Vital signs in last 24 hours: Temp:  [98.8 F (37.1 C)-99.4 F (37.4 C)] 98.8 F (37.1 C) (01/05 0555) Pulse Rate:  [53-61] 53 (01/05 0555) Resp:  [16-18] 16 (01/05 0555) BP: (111-138)/(71-81) 138/77 (01/05 0555) SpO2:  [96 %-97 %] 97 % (01/05 0555)    Intake/Output from previous day: 01/04 0701 - 01/05 0700 In: 300 [P.O.:300] Out: -  Intake/Output this shift: No intake/output data recorded.  Gen: NAD  Resp: nonlabored  Card: bradycardic  Abd: soft, incision c/d/i, nondistended  Lab Results: CBC  Recent Labs    10/27/20 0135 10/28/20 0101  WBC 10.0 6.8  HGB 14.3 13.2  HCT 42.9 39.2  PLT 160 155   BMET Recent Labs    10/27/20 0135 10/28/20 0101  NA 139 140  K 4.8 4.1  CL 108 103  CO2 25 29  GLUCOSE 109* 103*  BUN 10 12  CREATININE 1.10 1.13  CALCIUM 8.5* 8.5*   PT/INR No results for input(s): LABPROT, INR in the last 72 hours. ABG No results for input(s): PHART, HCO3 in the last 72 hours.  Invalid input(s): PCO2, PO2  Anti-infectives: Anti-infectives (From admission, onward)   Start     Dose/Rate Route Frequency Ordered Stop   10/26/20 0545  metroNIDAZOLE (FLAGYL) IVPB 500 mg  Status:  Discontinued        500 mg 100 mL/hr over 60 Minutes Intravenous  Once 10/26/20 0529 10/26/20 0541   10/26/20 0530  ceFAZolin (ANCEF) powder 1 g  Status:  Discontinued        1 g Other To Surgery 10/26/20 0529 10/26/20 0538   10/26/20 0500  piperacillin-tazobactam (ZOSYN) IVPB 3.375 g        3.375 g 100 mL/hr over 30 Minutes Intravenous  Once 10/26/20 0451 10/26/20 0556      Medications: Scheduled Meds: . acetaminophen  650 mg Oral Q6H  . enoxaparin (LOVENOX) injection  40 mg Subcutaneous Daily  . gabapentin  300 mg Oral TID   Continuous Infusions: . sodium chloride     Followed  by  . sodium chloride    . lactated ringers 125 mL/hr at 10/28/20 0017   PRN Meds:.HYDROmorphone (DILAUDID) injection, ondansetron **OR** ondansetron (ZOFRAN) IV, oxyCODONE, oxyCODONE, prochlorperazine **OR** prochlorperazine  Assessment/Plan: s/p Procedure(s): EXPLORATORY LAPAROTOMY SMALL BOWEL RESECTION 10/26/2020 Continues to do well, no flatus or BM -continue diet -ambulate -pain control -await return of bowel function    LOS: 2 days   Rodman Pickle, MD [phone] [facility]

## 2020-10-28 NOTE — Discharge Instructions (Signed)
[facility]      [facility] [phone]  OPEN ABDOMINAL SURGERY: POST OP INSTRUCTIONS  Always review your discharge instruction sheet given to you by the facility where your surgery was performed.  IF YOU HAVE DISABILITY OR FAMILY LEAVE FORMS, YOU MUST BRING THEM TO THE OFFICE FOR PROCESSING.  PLEASE DO NOT GIVE THEM TO YOUR DOCTOR.  1. A prescription for pain medication may be given to you upon discharge.  Take your pain medication as prescribed, if needed.  If narcotic pain medicine is not needed, then you may take acetaminophen (Tylenol) or ibuprofen (Advil) as needed. 2. Take your usually prescribed medications unless otherwise directed. 3. If you need a refill on your pain medication, please contact your pharmacy. They will contact our office to request authorization.  Prescriptions will not be filled after 5pm or on week-ends. 4. You should follow a light diet the first few days after arrival home, such as soup and crackers, pudding, etc.unless your doctor has advised otherwise. A high-fiber, low fat diet can be resumed as tolerated.   Be sure to include lots of fluids daily. Most patients will experience some swelling and bruising on the chest and neck area.  Ice packs will help.  Swelling and bruising can take several days to resolve 5. Most patients will experience some swelling and bruising in the area of the incision. Ice pack will help. Swelling and bruising can take several days to resolve..  6. It is common to experience some constipation if taking pain medication after surgery.  Increasing fluid intake and taking a stool softener will usually help or prevent this problem from occurring.  A mild laxative (Milk of Magnesia or Miralax) should be taken according to package directions if there are no bowel movements after 48 hours. 7.  You may have steri-strips (small skin tapes) in place directly over the incision.  These strips should be left on the skin for 7-10 days.  If your  surgeon used skin glue on the incision, you may shower in 24 hours.  The glue will flake off over the next 2-3 weeks.  Any sutures or staples will be removed at the office during your follow-up visit. You may find that a light gauze bandage over your incision may keep your staples from being rubbed or pulled. You may shower and replace the bandage daily. 8. ACTIVITIES:  You may resume regular (light) daily activities beginning the next day--such as daily self-care, walking, climbing stairs--gradually increasing activities as tolerated.  You may have sexual intercourse when it is comfortable.  Refrain from any heavy lifting or straining until approved by your doctor. a. You may drive when you no longer are taking prescription pain medication, you can comfortably wear a seatbelt, and you can safely maneuver your car and apply brakes b. Return to Work: ___________________________________ 9. You should see your doctor in the office for a follow-up appointment approximately two weeks after your surgery.  Make sure that you call for this appointment within a day or two after you arrive home to insure a convenient appointment time. OTHER INSTRUCTIONS:  _____________________________________________________________ _____________________________________________________________  WHEN TO CALL YOUR DOCTOR: 1. Fever over 101.0 2. Inability to urinate 3. Nausea and/or vomiting 4. Extreme swelling or bruising 5. Continued bleeding from incision. 6. Increased pain, redness, or drainage from the incision. 7. Difficulty swallowing or breathing 8. Muscle cramping or spasms. 9. Numbness or tingling in hands or feet or around lips.  The clinic staff is available to   answer your questions during regular business hours.  Please don't hesitate to call and ask to speak to one of the nurses if you have concerns.  For further questions, please visit [email or web address]   

## 2020-10-29 LAB — CBC
HCT: 41.8 % (ref 39.0–52.0)
Hemoglobin: 13.6 g/dL (ref 13.0–17.0)
MCH: 29.3 pg (ref 26.0–34.0)
MCHC: 32.5 g/dL (ref 30.0–36.0)
MCV: 90.1 fL (ref 80.0–100.0)
Platelets: 172 10*3/uL (ref 150–400)
RBC: 4.64 MIL/uL (ref 4.22–5.81)
RDW: 12.1 % (ref 11.5–15.5)
WBC: 5.9 10*3/uL (ref 4.0–10.5)
nRBC: 0 % (ref 0.0–0.2)

## 2020-10-29 LAB — BASIC METABOLIC PANEL
Anion gap: 10 (ref 5–15)
BUN: 14 mg/dL (ref 6–20)
CO2: 24 mmol/L (ref 22–32)
Calcium: 8.4 mg/dL — ABNORMAL LOW (ref 8.9–10.3)
Chloride: 98 mmol/L (ref 98–111)
Creatinine, Ser: 1.15 mg/dL (ref 0.61–1.24)
GFR, Estimated: >60 mL/min (ref >60)
Glucose, Bld: 106 mg/dL — ABNORMAL HIGH (ref 70–99)
Potassium: 4.2 mmol/L (ref 3.5–5.1)
Sodium: 132 mmol/L — ABNORMAL LOW (ref 135–145)

## 2020-10-29 MED ORDER — POLYETHYLENE GLYCOL 3350 17 G PO PACK: 17.0000 g | PACK | Freq: Every day | ORAL | 0 refills | Status: AC | PRN

## 2020-10-29 MED ORDER — OXYCODONE HCL 5 MG PO TABS: 5.0000 mg | ORAL_TABLET | Freq: Four times a day (QID) | ORAL | 0 refills | Status: AC | PRN

## 2020-10-29 NOTE — Discharge Summary (Signed)
Physician Discharge Summary  Patient ID: Jon Brady MRN: [id number] DOB/AGE: 01-Apr-1976 45 y.o.  Admit date: 10/26/2020 Discharge date: 10/29/2020  Admission Diagnoses:  Discharge Diagnoses:  Active Problems:   Small bowel obstruction [facility])   Discharged Condition: good  Hospital Course: 45 yo male presented with acute onset of abdominal pain and work up concerning for bowel ischemia from volvulus. He was taken to the operating room where the intestine was reduced and 120 cm of small intestine removed with anastomosis. Post op he was admitted to the surgical floor. He was slowly advanced on a diet and did wel and was discharged home POD 3.  Consults: None  Significant Diagnostic Studies: labs:  CBC    Component Value Date/Time   WBC 5.9 10/29/2020 0105   RBC 4.64 10/29/2020 0105   HGB 13.6 10/29/2020 0105   HCT 41.8 10/29/2020 0105   PLT 172 10/29/2020 0105   MCV 90.1 10/29/2020 0105   MCH 29.3 10/29/2020 0105   MCHC 32.5 10/29/2020 0105   RDW 12.1 10/29/2020 0105     Treatments: surgery: small bowel resection  Discharge Exam: Blood pressure 127/66, pulse (!) 55, temperature 98.1 F (36.7 C), temperature source Oral, resp. rate 20, height 6\' 2"  (1.88 m), weight 81.6 kg, SpO2 96 %. General appearance: alert and cooperative Head: Normocephalic, without obvious abnormality, atraumatic Resp: clear to auscultation bilaterally GI: soft, non-tender; bowel sounds normal; no masses,  no organomegaly  Disposition: Discharge disposition: 01-Home or Self Care        Allergies as of 10/29/2020   No Known Allergies     Medication List    TAKE these medications   acetaminophen 500 MG tablet Commonly known as: TYLENOL Take 1,000 mg by mouth every 6 (six) hours as needed for mild pain.   acyclovir 800 MG tablet Commonly known as: ZOVIRAX Take 800 mg by mouth daily.   melatonin 3 MG Tabs tablet Take 3 mg by mouth at bedtime as needed (sleep).   oxyCODONE 5 MG immediate  release tablet Commonly known as: Oxy IR/ROXICODONE Take 1 tablet (5 mg total) by mouth every 6 (six) hours as needed for severe pain.   polyethylene glycol 17 g packet Commonly known as: MiraLax Take 17 g by mouth daily as needed for mild constipation.       Follow-up Information    Stechschulte, 12/27/2020, MD. Go on 11/13/2020.   Specialty: Surgery Why: Your appointment is 1/21 at 3:30pm Please arrive 30 minutes prior to your appointment to check in and fill out paperwork. Bring photo ID and insurance information. Contact information: [address] [place] [phone]               Signed: [phone] Kaylina Cahue 10/29/2020, 9:50 AM

## 2021-09-08 DIAGNOSIS — A6000 Herpesviral infection of urogenital system, unspecified: Secondary | ICD-10-CM | POA: Diagnosis not present

## 2022-01-10 IMAGING — CT CT RENAL STONE PROTOCOL
2 of 4 series · 14 of 46 positions shown, 16 images · non-contrast
Comparison: None.
COMPARISON: None.

Addendum:
CLINICAL DATA: Bilateral flank pain, epigastric abdominal pain

EXAM:
CT ABDOMEN AND PELVIS WITHOUT CONTRAST
TECHNIQUE: Multidetector CT imaging of the abdomen and pelvis was performed
following the standard protocol without IV contrast.

[Series 3: ap without · axial · non-contrast · 0.81mm/px · z∈[+861,+1316]mm · 11 of 103 slices shown, 13 images]
[im 6/103  soft-tissue]
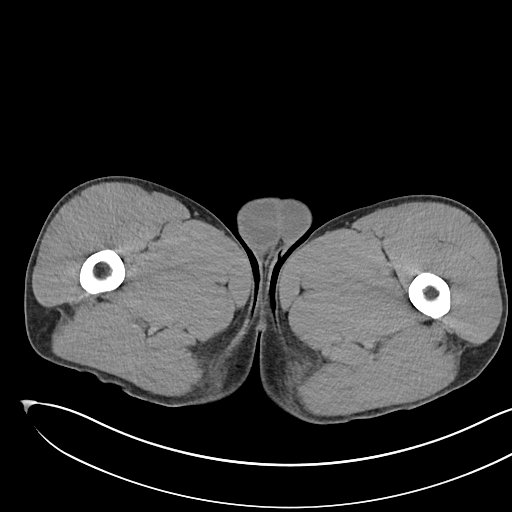
[im 6/103  bone]
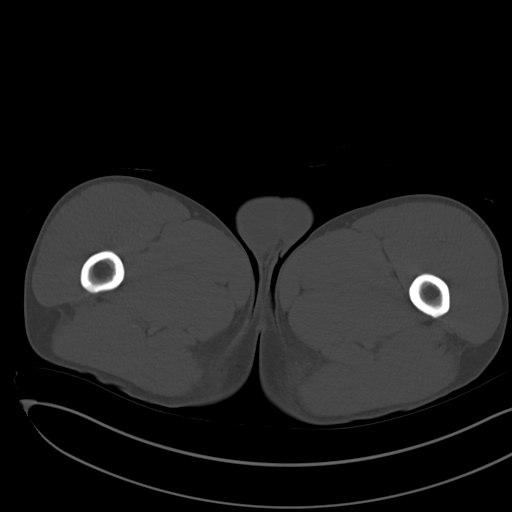
[im 18/103  soft-tissue]
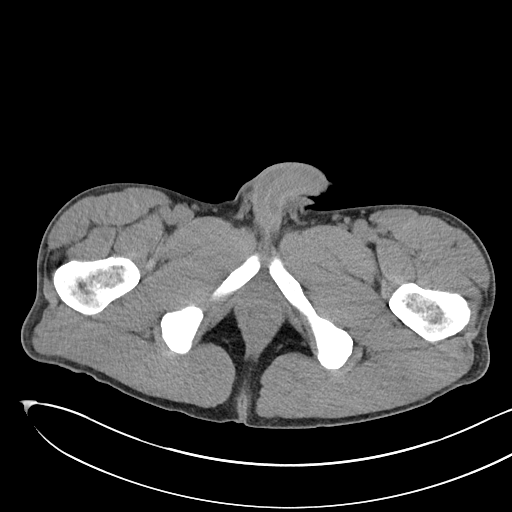
[im 23/103  soft-tissue]
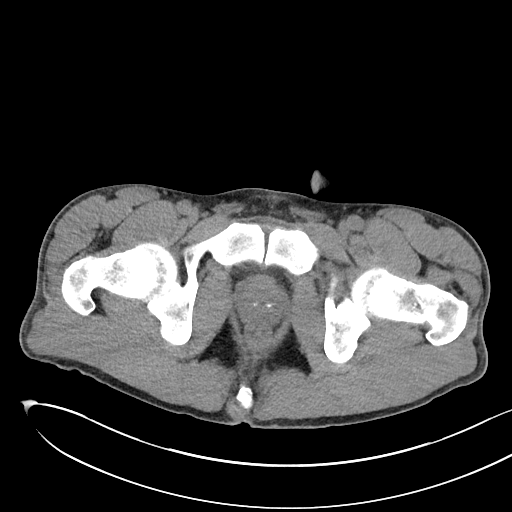
[im 35/103  soft-tissue]
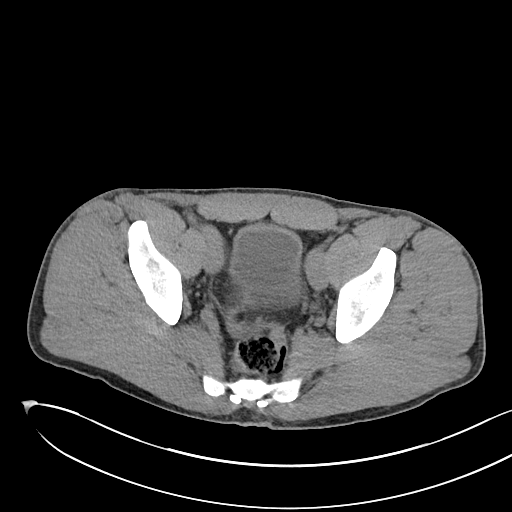
[im 40/103  soft-tissue]
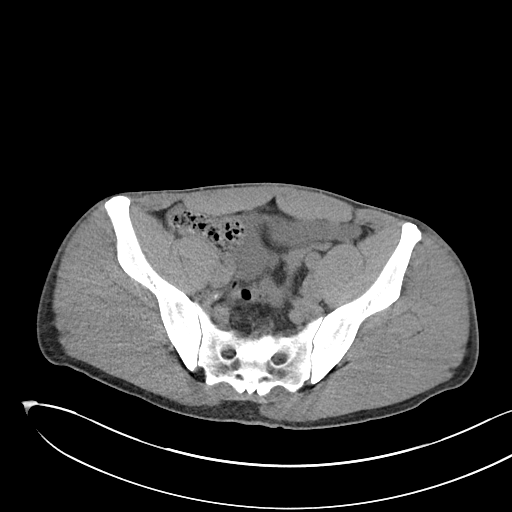
[im 52/103  soft-tissue]
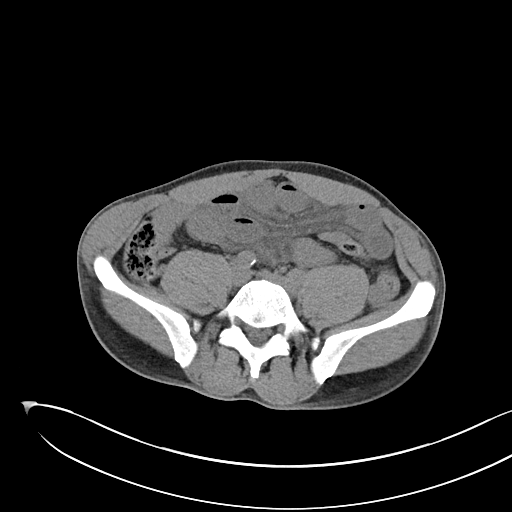
[im 63/103  soft-tissue]
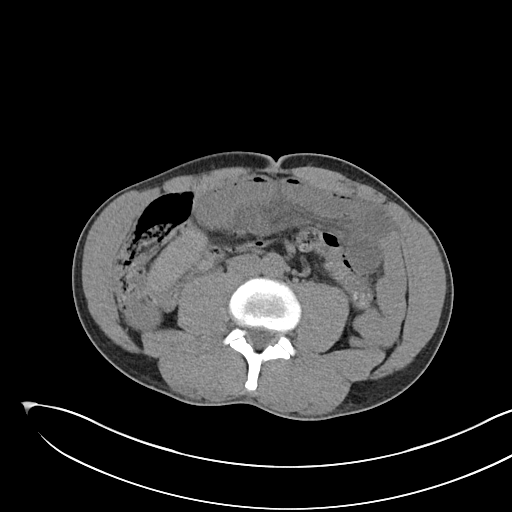
[im 69/103  soft-tissue]
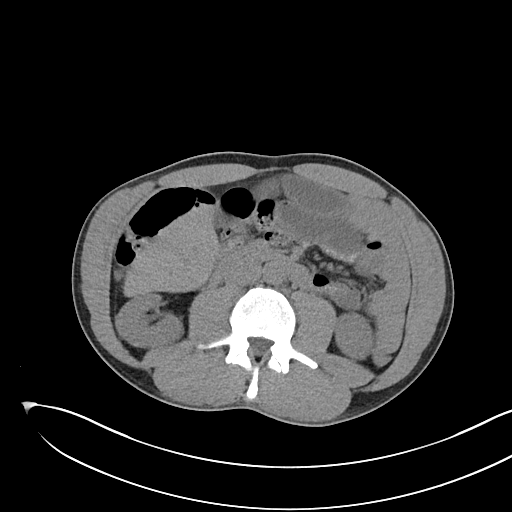
[im 80/103  soft-tissue]
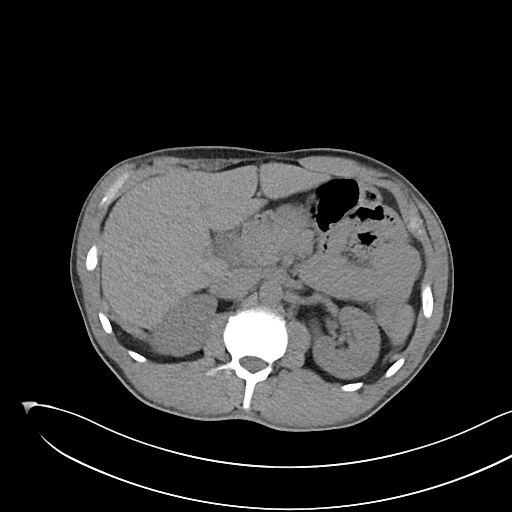
[im 80/103  bone]
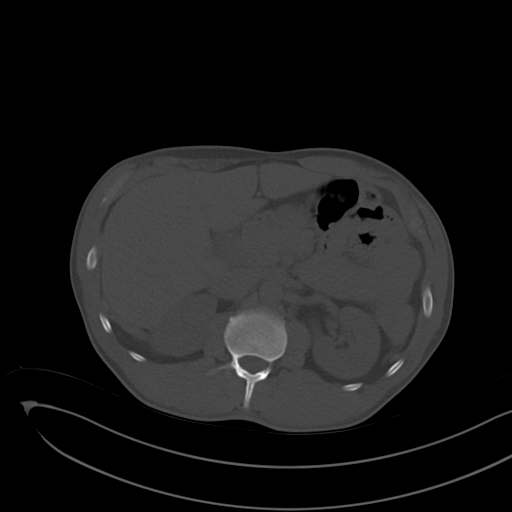
[im 86/103  soft-tissue]
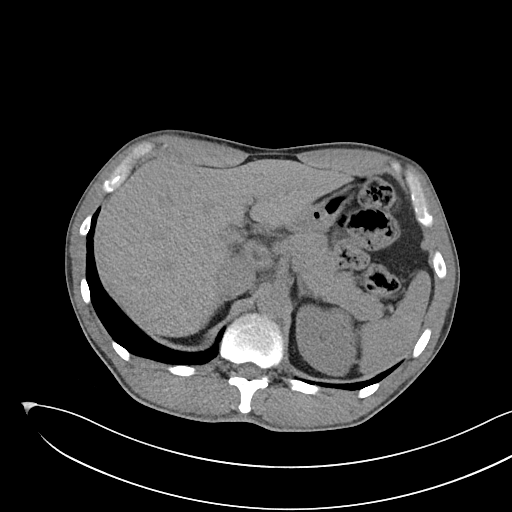
[im 97/103  soft-tissue]
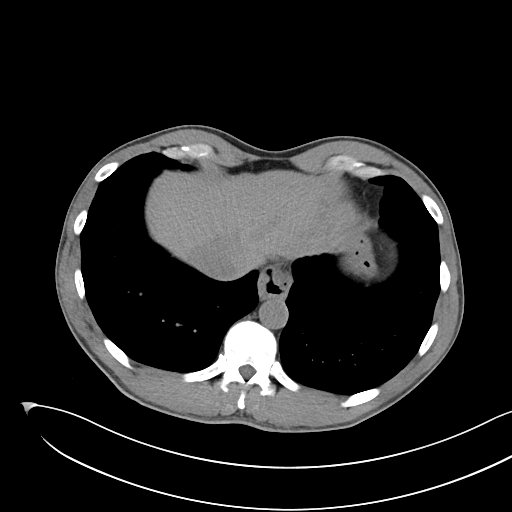

[Series 6: cor · coronal · 0.86mm/px · 3 of 83 slices shown]
[im 28/83  soft-tissue]
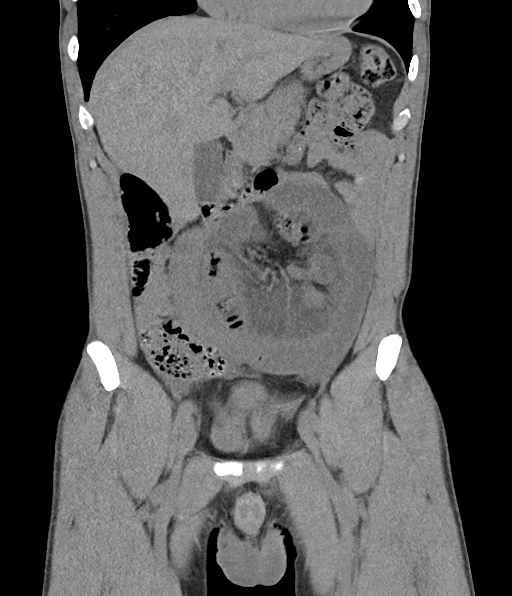
[im 37/83  soft-tissue]
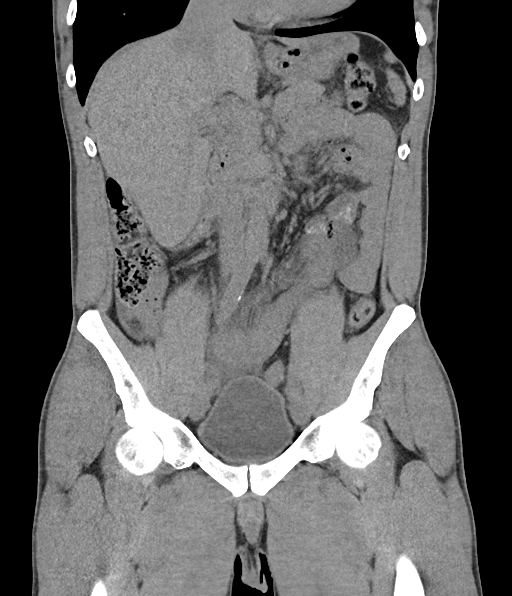
[im 46/83  soft-tissue]
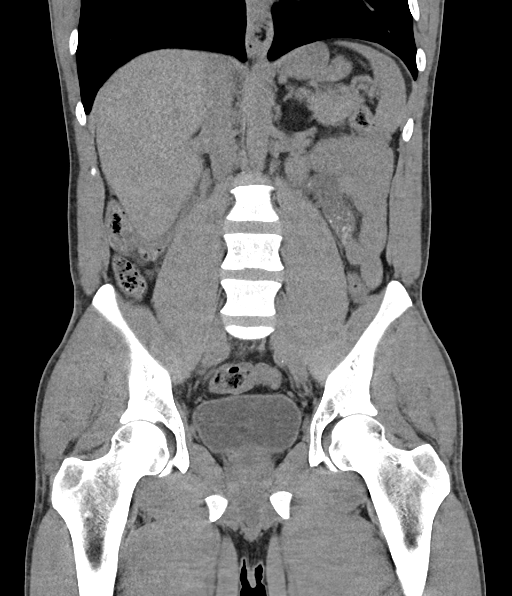

[14 of 46 positions shown; findings below may reference images not displayed]

FINDINGS: Lower chest: The visualized lung bases are clear bilaterally.
Visualized heart and pericardium are unremarkable.

Hepatobiliary: No focal liver abnormality is seen. No gallstones,
gallbladder wall thickening, or biliary dilatation.

Pancreas: Unremarkable

Spleen: Unremarkable

Adrenals/Urinary Tract: Adrenal glands are unremarkable. Kidneys are
normal, without renal calculi, focal lesion, or hydronephrosis.
Bladder is unremarkable.

Stomach/Bowel: There is a closed loop small bowel obstruction with 2
points of transition in close proximity to each other seen within
the left upper quadrant of the abdomen with the first at axial image
# 33 and coronal image # 32 and the second at axial image # 47 and
coronal image # 28. The intervening small bowel is dilated
fluid-filled, thick walled, and demonstrates extensive mesenteric
edema. No evidence of perforation at this time, however. No free
intraperitoneal gas or fluid.

Small hiatal hernia. The proximal stomach and small bowel as well as
the colon distally are decompressed and are unremarkable. The
appendix is normal.

Vascular/Lymphatic: No significant vascular findings are present. No
enlarged abdominal or pelvic lymph nodes.

Reproductive: Prostate is unremarkable.

Other: No pathologic adenopathy.  Rectum unremarkable.

Musculoskeletal: No acute bone abnormality.
IMPRESSION: Closed loop obstruction with 2 points of transition within the left
upper quadrant involving the small bowel. The intervening loop of
small bowel is dilated, thick-walled, and demonstrates extensive
mesenteric edema, however, there is no evidence of perforation at
this time. Urgent surgical consultation is warranted.

Attempts are being made at this time to contact the managing
clinician for direct verbal communication.

ADDENDUM:
These results were called by telephone at the time of interpretation
on 10/26/2020 at [DATE] to provider TIMA PRUDENCIO , who verbally
acknowledged these results.

*** End of Addendum ***
FINDINGS: Lower chest: The visualized lung bases are clear bilaterally.
Visualized heart and pericardium are unremarkable.

Hepatobiliary: No focal liver abnormality is seen. No gallstones,
gallbladder wall thickening, or biliary dilatation.

Pancreas: Unremarkable

Spleen: Unremarkable

Adrenals/Urinary Tract: Adrenal glands are unremarkable. Kidneys are
normal, without renal calculi, focal lesion, or hydronephrosis.
Bladder is unremarkable.

Stomach/Bowel: There is a closed loop small bowel obstruction with 2
points of transition in close proximity to each other seen within
the left upper quadrant of the abdomen with the first at axial image
# 33 and coronal image # 32 and the second at axial image # 47 and
coronal image # 28. The intervening small bowel is dilated
fluid-filled, thick walled, and demonstrates extensive mesenteric
edema. No evidence of perforation at this time, however. No free
intraperitoneal gas or fluid.

Small hiatal hernia. The proximal stomach and small bowel as well as
the colon distally are decompressed and are unremarkable. The
appendix is normal.

Vascular/Lymphatic: No significant vascular findings are present. No
enlarged abdominal or pelvic lymph nodes.

Reproductive: Prostate is unremarkable.

Other: No pathologic adenopathy.  Rectum unremarkable.

Musculoskeletal: No acute bone abnormality.
IMPRESSION: Closed loop obstruction with 2 points of transition within the left
upper quadrant involving the small bowel. The intervening loop of
small bowel is dilated, thick-walled, and demonstrates extensive
mesenteric edema, however, there is no evidence of perforation at
this time. Urgent surgical consultation is warranted.

Attempts are being made at this time to contact the managing
clinician for direct verbal communication.

## 2023-01-04 DIAGNOSIS — A6000 Herpesviral infection of urogenital system, unspecified: Secondary | ICD-10-CM | POA: Diagnosis not present

## 2023-05-29 DIAGNOSIS — Z0000 Encounter for general adult medical examination without abnormal findings: Secondary | ICD-10-CM | POA: Diagnosis not present

## 2023-05-29 DIAGNOSIS — Z125 Encounter for screening for malignant neoplasm of prostate: Secondary | ICD-10-CM | POA: Diagnosis not present

## 2023-05-29 DIAGNOSIS — Z13220 Encounter for screening for lipoid disorders: Secondary | ICD-10-CM | POA: Diagnosis not present

## 2023-07-25 DIAGNOSIS — D122 Benign neoplasm of ascending colon: Secondary | ICD-10-CM | POA: Diagnosis not present

## 2023-07-25 DIAGNOSIS — Z83719 Family history of colon polyps, unspecified: Secondary | ICD-10-CM | POA: Diagnosis not present

## 2023-07-25 DIAGNOSIS — Z1211 Encounter for screening for malignant neoplasm of colon: Secondary | ICD-10-CM | POA: Diagnosis not present

## 2023-10-05 DIAGNOSIS — F411 Generalized anxiety disorder: Secondary | ICD-10-CM | POA: Diagnosis not present

## 2023-10-05 DIAGNOSIS — J0110 Acute frontal sinusitis, unspecified: Secondary | ICD-10-CM | POA: Diagnosis not present

## 2023-10-19 DIAGNOSIS — F4323 Adjustment disorder with mixed anxiety and depressed mood: Secondary | ICD-10-CM | POA: Diagnosis not present

## 2023-11-07 DIAGNOSIS — F4323 Adjustment disorder with mixed anxiety and depressed mood: Secondary | ICD-10-CM | POA: Diagnosis not present

## 2023-11-09 DIAGNOSIS — F4323 Adjustment disorder with mixed anxiety and depressed mood: Secondary | ICD-10-CM | POA: Diagnosis not present

## 2023-11-28 DIAGNOSIS — F4323 Adjustment disorder with mixed anxiety and depressed mood: Secondary | ICD-10-CM | POA: Diagnosis not present

## 2023-12-20 DIAGNOSIS — F4323 Adjustment disorder with mixed anxiety and depressed mood: Secondary | ICD-10-CM | POA: Diagnosis not present

## 2024-01-12 DIAGNOSIS — F4323 Adjustment disorder with mixed anxiety and depressed mood: Secondary | ICD-10-CM | POA: Diagnosis not present

## 2024-01-31 DIAGNOSIS — F4323 Adjustment disorder with mixed anxiety and depressed mood: Secondary | ICD-10-CM | POA: Diagnosis not present

## 2024-02-19 DIAGNOSIS — F4323 Adjustment disorder with mixed anxiety and depressed mood: Secondary | ICD-10-CM | POA: Diagnosis not present

## 2024-03-04 DIAGNOSIS — F4323 Adjustment disorder with mixed anxiety and depressed mood: Secondary | ICD-10-CM | POA: Diagnosis not present

## 2024-03-19 DIAGNOSIS — F4323 Adjustment disorder with mixed anxiety and depressed mood: Secondary | ICD-10-CM | POA: Diagnosis not present

## 2024-04-05 DIAGNOSIS — F4323 Adjustment disorder with mixed anxiety and depressed mood: Secondary | ICD-10-CM | POA: Diagnosis not present

## 2024-04-19 DIAGNOSIS — F4323 Adjustment disorder with mixed anxiety and depressed mood: Secondary | ICD-10-CM | POA: Diagnosis not present

## 2024-07-02 DIAGNOSIS — Z13220 Encounter for screening for lipoid disorders: Secondary | ICD-10-CM | POA: Diagnosis not present

## 2024-07-02 DIAGNOSIS — Z0000 Encounter for general adult medical examination without abnormal findings: Secondary | ICD-10-CM | POA: Diagnosis not present

## 2024-07-12 DIAGNOSIS — G4700 Insomnia, unspecified: Secondary | ICD-10-CM | POA: Diagnosis not present

## 2024-07-12 DIAGNOSIS — Z1329 Encounter for screening for other suspected endocrine disorder: Secondary | ICD-10-CM | POA: Diagnosis not present

## 2024-07-12 DIAGNOSIS — Z131 Encounter for screening for diabetes mellitus: Secondary | ICD-10-CM | POA: Diagnosis not present

## 2024-07-12 DIAGNOSIS — E559 Vitamin D deficiency, unspecified: Secondary | ICD-10-CM | POA: Diagnosis not present

## 2024-07-12 DIAGNOSIS — R5383 Other fatigue: Secondary | ICD-10-CM | POA: Diagnosis not present

## 2024-07-12 DIAGNOSIS — L309 Dermatitis, unspecified: Secondary | ICD-10-CM | POA: Diagnosis not present

## 2024-07-12 DIAGNOSIS — Z125 Encounter for screening for malignant neoplasm of prostate: Secondary | ICD-10-CM | POA: Diagnosis not present

## 2024-08-12 DIAGNOSIS — E559 Vitamin D deficiency, unspecified: Secondary | ICD-10-CM | POA: Diagnosis not present

## 2024-08-12 DIAGNOSIS — R5382 Chronic fatigue, unspecified: Secondary | ICD-10-CM | POA: Diagnosis not present

## 2024-08-12 DIAGNOSIS — L309 Dermatitis, unspecified: Secondary | ICD-10-CM | POA: Diagnosis not present

## 2024-08-12 DIAGNOSIS — E291 Testicular hypofunction: Secondary | ICD-10-CM | POA: Diagnosis not present
